# Patient Record
Sex: Female | Born: 1980 | Hispanic: Yes | Smoking: Never smoker
Health system: Southern US, Community
[De-identification: ages and names within clinical notes are randomized; demographics above are authoritative.]

## PROBLEM LIST (undated history)

## (undated) ENCOUNTER — Emergency Department (HOSPITAL_COMMUNITY): Admission: EM | Payer: No Typology Code available for payment source | Source: Home / Self Care

---
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
## 2010-11-04 ENCOUNTER — Emergency Department (HOSPITAL_COMMUNITY): Admission: EM | Admit: 2010-11-04 | Discharge: 2010-11-04 | Payer: Self-pay | Admitting: Emergency Medicine

## 2010-12-31 NOTE — L&D Delivery Note (Signed)
Delivery Note At  a viable female was delivered via  (Presentation: ; LOA ).  Cord was clamped and cut and infant was placed on mother's abdomen.  Cord blood was sampled. APGAR:8 9, ; weight 7lb13oz .   Placenta status: intact , .  Good uterine firming with fundal massage and pitocin.  Cord:  with the following complications: .    Anesthesia:  epidural Lacerations: none Est. Blood Loss (mL):  Mom to postpartum.  Baby to nursery-stable.  Lindaann Slough MD 08/17/2011, 2:46 AM

## 2011-01-22 ENCOUNTER — Emergency Department (HOSPITAL_COMMUNITY)
Admission: EM | Admit: 2011-01-22 | Discharge: 2011-01-23 | Payer: Self-pay | Source: Home / Self Care | Admitting: Emergency Medicine

## 2011-01-24 LAB — URINE MICROSCOPIC-ADD ON

## 2011-01-24 LAB — URINALYSIS, ROUTINE W REFLEX MICROSCOPIC
Urine Glucose, Fasting: NEGATIVE mg/dL
pH: 7.5 (ref 5.0–8.0)

## 2011-03-07 ENCOUNTER — Other Ambulatory Visit: Payer: Self-pay | Admitting: Family Medicine

## 2011-03-07 DIAGNOSIS — Z3689 Encounter for other specified antenatal screening: Secondary | ICD-10-CM

## 2011-03-07 LAB — RUBELLA ANTIBODY, IGM: Rubella: IMMUNE

## 2011-03-07 LAB — HIV ANTIBODY (ROUTINE TESTING W REFLEX): HIV: NONREACTIVE

## 2011-03-07 LAB — HEPATITIS B SURFACE ANTIGEN: Hepatitis B Surface Ag: NEGATIVE

## 2011-03-07 LAB — ANTIBODY SCREEN: Antibody Screen: NEGATIVE

## 2011-03-07 LAB — ABO/RH

## 2011-03-12 ENCOUNTER — Ambulatory Visit (HOSPITAL_COMMUNITY)
Admission: RE | Admit: 2011-03-12 | Discharge: 2011-03-12 | Disposition: A | Discharge status: Home / Self Care | Payer: Medicaid Other | Source: Ambulatory Visit | Attending: Family Medicine | Admitting: Family Medicine

## 2011-03-12 DIAGNOSIS — O358XX0 Maternal care for other (suspected) fetal abnormality and damage, not applicable or unspecified: Secondary | ICD-10-CM | POA: Insufficient documentation

## 2011-03-12 DIAGNOSIS — Z3689 Encounter for other specified antenatal screening: Secondary | ICD-10-CM

## 2011-03-12 DIAGNOSIS — Z8751 Personal history of pre-term labor: Secondary | ICD-10-CM | POA: Insufficient documentation

## 2011-03-12 DIAGNOSIS — Z1389 Encounter for screening for other disorder: Secondary | ICD-10-CM | POA: Insufficient documentation

## 2011-03-12 DIAGNOSIS — Z363 Encounter for antenatal screening for malformations: Secondary | ICD-10-CM | POA: Insufficient documentation

## 2011-03-12 IMAGING — US US OB DETAIL+14 WK
1 series · 12 of 28 positions shown · non-contrast
Comparison: none

[Series 1: us ob detail +14 wk · 12 of 88 slices shown]
[im 4/88]
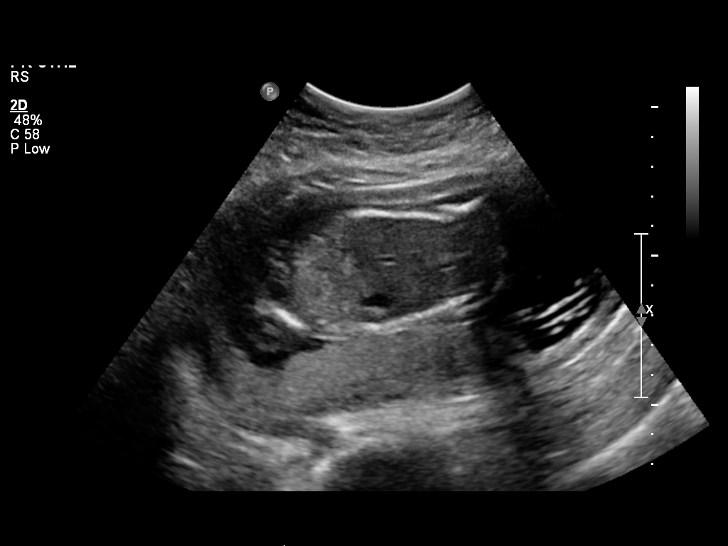
[im 10/88]
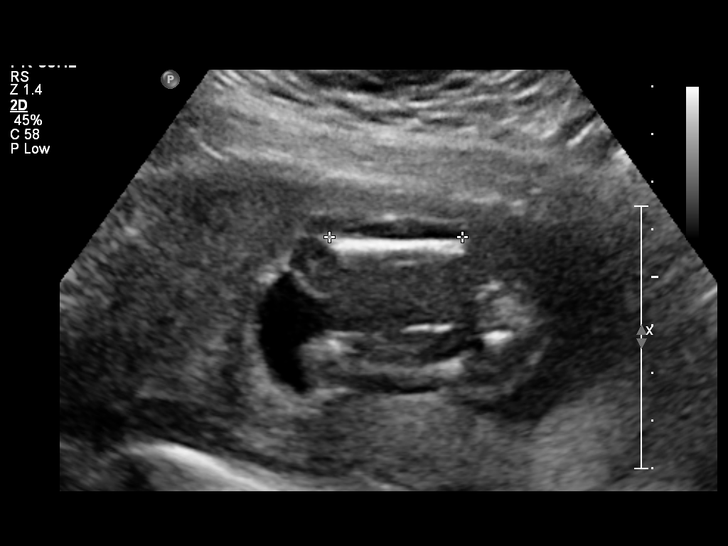
[im 17/88]
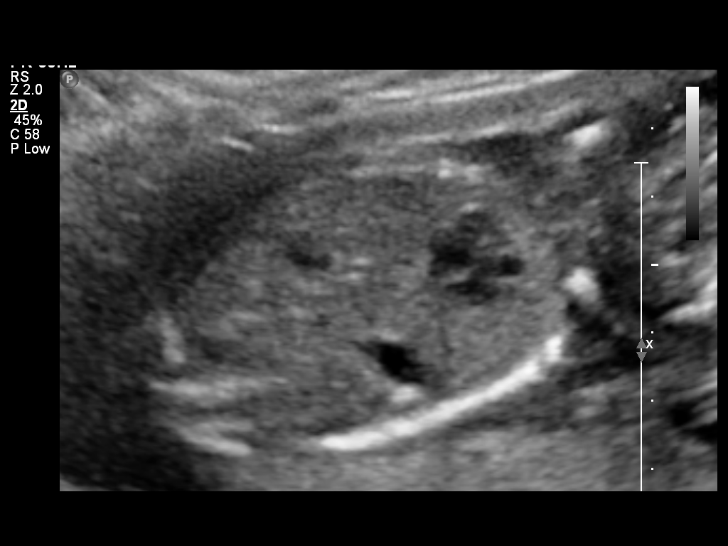
[im 26/88]
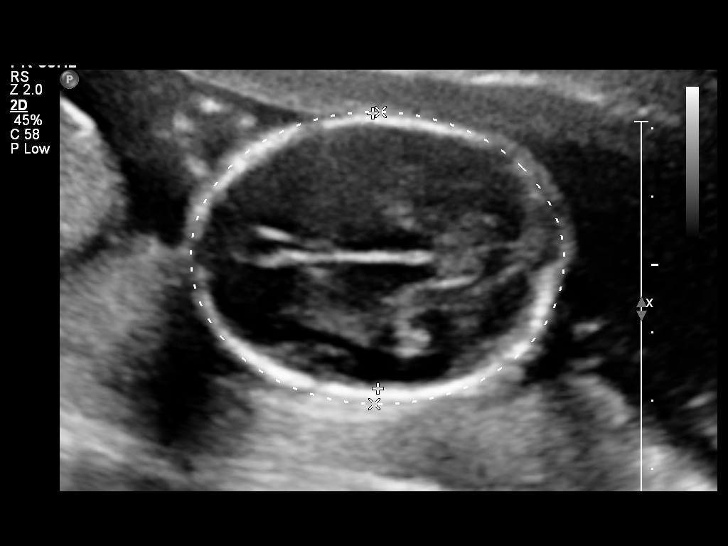
[im 33/88]
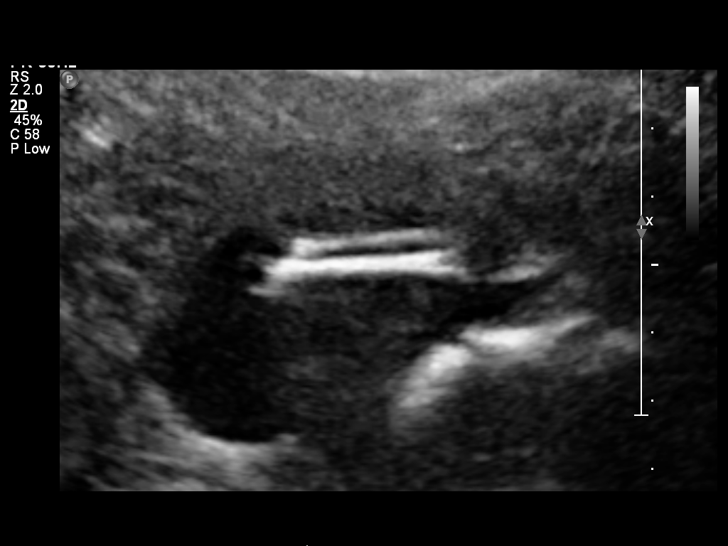
[im 39/88]
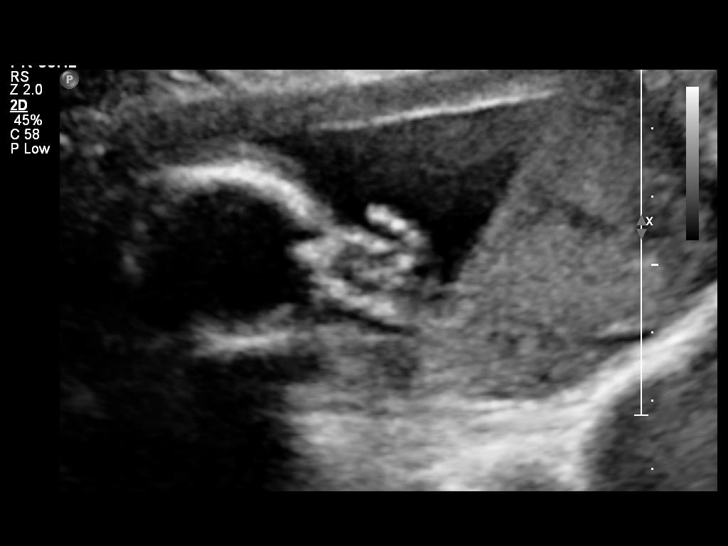
[im 49/88]
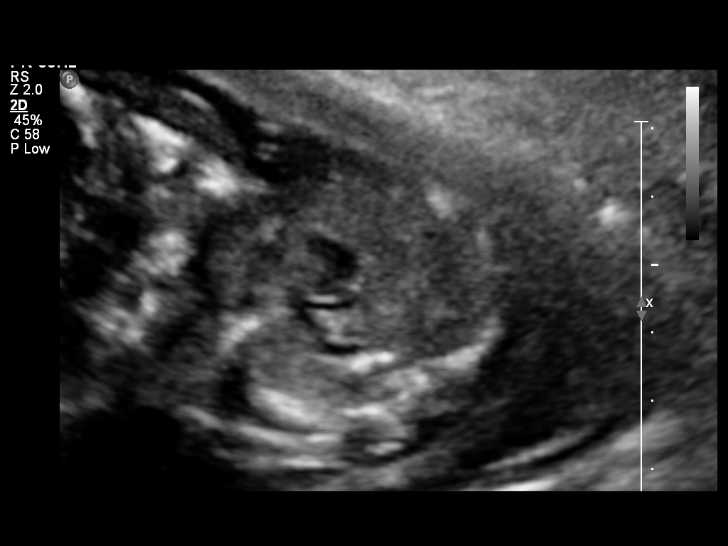
[im 55/88]
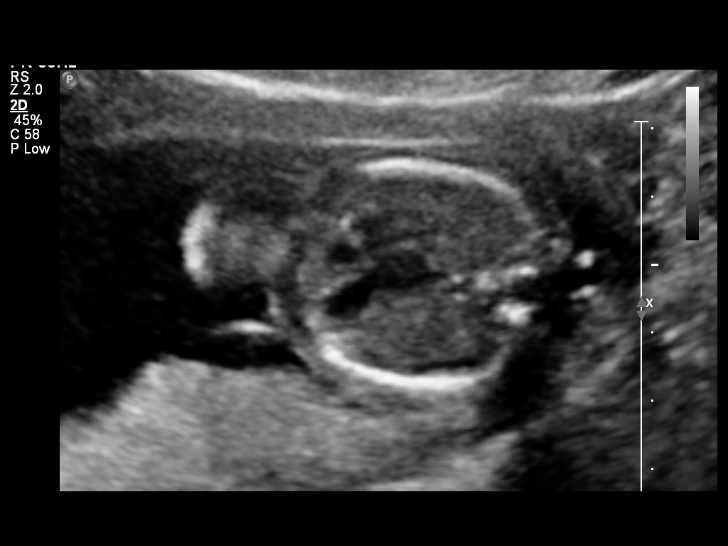
[im 62/88]
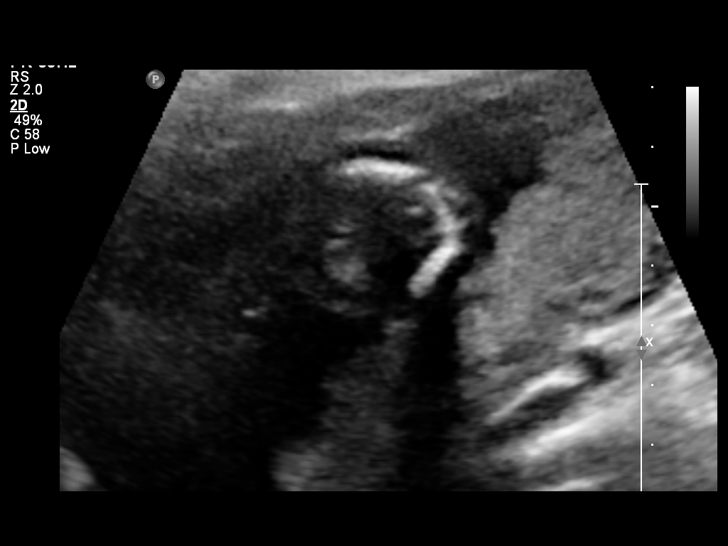
[im 71/88]
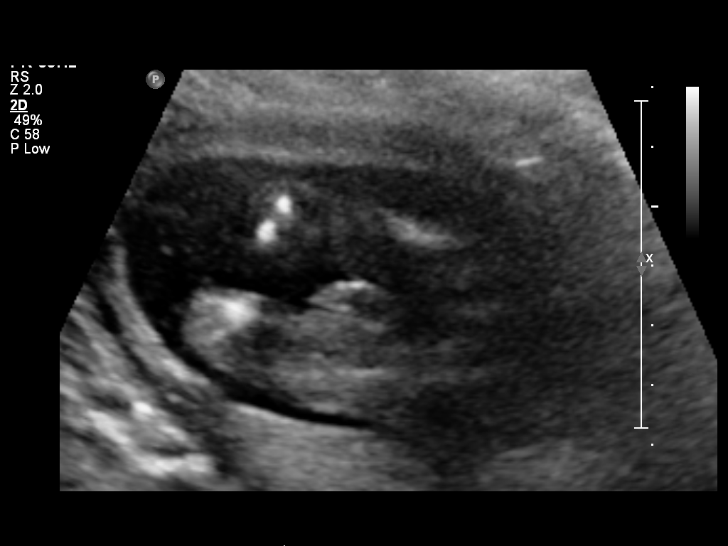
[im 78/88]
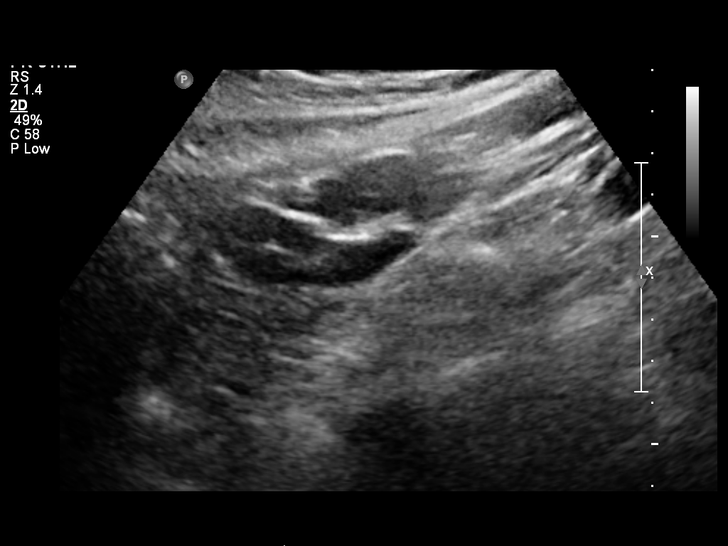
[im 84/88]
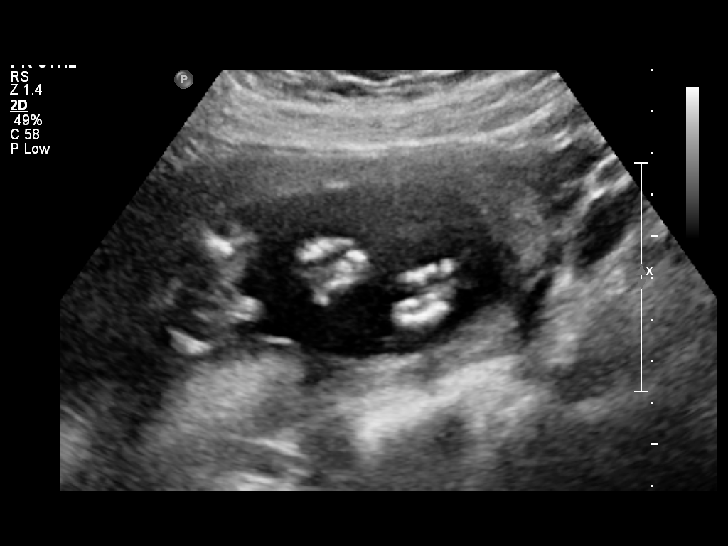

[12 of 28 positions shown; findings below may reference images not displayed]

OBSTETRICS REPORT
                      (Signed Final [DATE] [DATE])

 Order#:         [PHONE_NUMBER]_O
Procedures

 US OB DETAIL + 14 WK                                  76811.0
Indications

 Detailed fetal anatomic survey                        655.83 [4X]
 Poor obstetric history: Previous preterm delivery     [4X]
Fetal Evaluation

 Cardiac Activity:  Observed at normal rate
 Presentation:      Cephalic
 Placenta:          Posterior, above cervical
                    os
 P. Cord            Visualized
 Insertion:

 Amniotic Fluid
 AFI FV:      Subjectively within normal limits
                                             Larg Pckt:     7.1  cm
Biometry

 BPD:     40.2  mm     G. Age:  18w 1d                CI:        69.52   70 - 86
                                                      FL/HC:      18.0   15.8 -
                                                                         18
 HC:     153.9  mm     G. Age:  18w 3d       54  %    HC/AC:      1.12   1.07 -

 AC:     137.8  mm     G. Age:  19w 1d       80  %    FL/BPD:
 FL:      27.7  mm     G. Age:  18w 3d       57  %    FL/AC:      20.1   20 - 24
 HUM:     28.5  mm     G. Age:  19w 1d       85  %
 NFT:     4.17  mm

 Est. FW:     258  gm      0 lb 9 oz     59  %
Gestational Age

 LMP:           18w 1d        Date:  [DATE]                 EDD:   [DATE]
 U/S Today:     18w 4d                                        EDD:   [DATE]
 Best:          18w 1d     Det. By:  LMP  ([DATE])          EDD:   [DATE]
Anatomy
 Cranium:           Appears normal      Aortic Arch:       Appears normal
 Fetal Cavum:       Appears normal      Ductal Arch:       Appears normal
 Ventricles:        Appears normal      Diaphragm:         Appears normal
 Choroid Plexus:    Appears normal      Stomach:           Appears normal
 Cerebellum:        Appears normal      Abdomen:           Appears normal
 Posterior Fossa:   Appears normal      Abdominal Wall:    Appears nml
                                                           (cord insert,
                                                           abd wall)
 Nuchal Fold:       Appears normal      Cord Vessels:      Appears normal
                    (neck, nuchal                          (3 vessel cord)
                    fold)
 Face:              Appears normal      Kidneys:           Appear normal
                    (lips/profile/orbit
                    s)
 Heart:             Appears normal      Bladder:           Appears normal
                    (4 chamber &
                    axis)
 RVOT:              Appears normal      Spine:             Appears normal
 LVOT:              Appears normal      Limbs:             Appears normal
                                                           (hands, ankles,
                                                           feet)

 Other:     Male gender. Heels and 5th digit visualized. Nasal bone
            visualized.
Cervix Uterus Adnexa

 Cervical Length:    4.58     cm

 Cervix:       Normal appearance by transabdominal scan.

 Adnexa:     No abnormality visualized.
Impression

 Single living intrauterine pregnancy in cephalic presentation.
 The estimated gestational age is 18w 1d based on LMP
 ([DATE]).
 No fetal anomalies are identified.

## 2011-03-13 LAB — URINALYSIS, ROUTINE W REFLEX MICROSCOPIC
Bilirubin Urine: NEGATIVE
Glucose, UA: NEGATIVE mg/dL
Ketones, ur: NEGATIVE mg/dL
Leukocytes, UA: NEGATIVE
Nitrite: NEGATIVE
Protein, ur: NEGATIVE mg/dL
Specific Gravity, Urine: 1.029 (ref 1.005–1.030)
Urobilinogen, UA: 0.2 mg/dL (ref 0.0–1.0)
pH: 5.5 (ref 5.0–8.0)

## 2011-03-13 LAB — CBC
MCH: 31.4 pg (ref 26.0–34.0)
MCV: 92 fL (ref 78.0–100.0)
Platelets: 229 10*3/uL (ref 150–400)
RBC: 4.64 MIL/uL (ref 3.87–5.11)

## 2011-03-13 LAB — HCG, QUANTITATIVE, PREGNANCY: hCG, Beta Chain, Quant, S: <2 m[IU]/mL (ref <5)

## 2011-03-13 LAB — WET PREP, GENITAL

## 2011-03-13 LAB — BASIC METABOLIC PANEL
Calcium: 9.2 mg/dL (ref 8.4–10.5)
Chloride: 106 mEq/L (ref 96–112)
Creatinine, Ser: 0.61 mg/dL (ref 0.4–1.2)
GFR calc Af Amer: >60 mL/min (ref >60)
GFR calc non Af Amer: >60 mL/min (ref >60)

## 2011-03-13 LAB — URINE MICROSCOPIC-ADD ON

## 2011-03-13 LAB — GC/CHLAMYDIA PROBE AMP, GENITAL: Chlamydia, DNA Probe: POSITIVE — AB

## 2011-03-13 LAB — POCT PREGNANCY, URINE: Preg Test, Ur: NEGATIVE

## 2011-03-22 ENCOUNTER — Other Ambulatory Visit: Payer: Self-pay | Admitting: Obstetrics & Gynecology

## 2011-03-22 DIAGNOSIS — O09219 Supervision of pregnancy with history of pre-term labor, unspecified trimester: Secondary | ICD-10-CM

## 2011-03-22 LAB — POCT URINALYSIS DIP (DEVICE)
Bilirubin Urine: NEGATIVE
Glucose, UA: NEGATIVE mg/dL
Hgb urine dipstick: NEGATIVE
Specific Gravity, Urine: 1.025 (ref 1.005–1.030)
Urobilinogen, UA: 0.2 mg/dL (ref 0.0–1.0)
pH: 6 (ref 5.0–8.0)

## 2011-03-29 ENCOUNTER — Ambulatory Visit: Payer: Medicaid Other

## 2011-03-29 DIAGNOSIS — O09219 Supervision of pregnancy with history of pre-term labor, unspecified trimester: Secondary | ICD-10-CM

## 2011-04-05 ENCOUNTER — Ambulatory Visit: Payer: Medicaid Other

## 2011-04-05 DIAGNOSIS — O09219 Supervision of pregnancy with history of pre-term labor, unspecified trimester: Secondary | ICD-10-CM

## 2011-04-12 ENCOUNTER — Other Ambulatory Visit: Payer: Self-pay | Admitting: Obstetrics & Gynecology

## 2011-04-12 DIAGNOSIS — O09219 Supervision of pregnancy with history of pre-term labor, unspecified trimester: Secondary | ICD-10-CM

## 2011-04-12 LAB — POCT URINALYSIS DIP (DEVICE)
Bilirubin Urine: NEGATIVE
Hgb urine dipstick: NEGATIVE
Ketones, ur: NEGATIVE mg/dL
Protein, ur: NEGATIVE mg/dL
pH: 5.5 (ref 5.0–8.0)

## 2011-04-19 ENCOUNTER — Ambulatory Visit: Payer: Medicaid Other

## 2011-04-19 DIAGNOSIS — O09219 Supervision of pregnancy with history of pre-term labor, unspecified trimester: Secondary | ICD-10-CM

## 2011-04-19 DIAGNOSIS — Z331 Pregnant state, incidental: Secondary | ICD-10-CM

## 2011-04-26 DIAGNOSIS — Z331 Pregnant state, incidental: Secondary | ICD-10-CM

## 2011-04-26 DIAGNOSIS — O09219 Supervision of pregnancy with history of pre-term labor, unspecified trimester: Secondary | ICD-10-CM

## 2011-05-03 ENCOUNTER — Ambulatory Visit: Payer: Medicaid Other

## 2011-05-03 DIAGNOSIS — O09219 Supervision of pregnancy with history of pre-term labor, unspecified trimester: Secondary | ICD-10-CM

## 2011-05-10 ENCOUNTER — Other Ambulatory Visit: Payer: Self-pay | Admitting: Physician Assistant

## 2011-05-10 DIAGNOSIS — O09219 Supervision of pregnancy with history of pre-term labor, unspecified trimester: Secondary | ICD-10-CM

## 2011-05-10 DIAGNOSIS — Z331 Pregnant state, incidental: Secondary | ICD-10-CM

## 2011-05-10 LAB — POCT URINALYSIS DIP (DEVICE)
Ketones, ur: NEGATIVE mg/dL
Protein, ur: NEGATIVE mg/dL
Specific Gravity, Urine: 1.025 (ref 1.005–1.030)
pH: 5.5 (ref 5.0–8.0)

## 2011-05-17 ENCOUNTER — Ambulatory Visit: Payer: Medicaid Other

## 2011-05-17 DIAGNOSIS — O09219 Supervision of pregnancy with history of pre-term labor, unspecified trimester: Secondary | ICD-10-CM

## 2011-05-24 ENCOUNTER — Other Ambulatory Visit: Payer: Self-pay | Admitting: Obstetrics and Gynecology

## 2011-05-24 DIAGNOSIS — O09219 Supervision of pregnancy with history of pre-term labor, unspecified trimester: Secondary | ICD-10-CM

## 2011-05-24 LAB — POCT URINALYSIS DIP (DEVICE)
Hgb urine dipstick: NEGATIVE
Protein, ur: NEGATIVE mg/dL
Specific Gravity, Urine: 1.025 (ref 1.005–1.030)
Urobilinogen, UA: 0.2 mg/dL (ref 0.0–1.0)
pH: 6 (ref 5.0–8.0)

## 2011-05-31 ENCOUNTER — Ambulatory Visit: Payer: Medicaid Other

## 2011-05-31 DIAGNOSIS — O09219 Supervision of pregnancy with history of pre-term labor, unspecified trimester: Secondary | ICD-10-CM

## 2011-06-01 ENCOUNTER — Inpatient Hospital Stay (HOSPITAL_COMMUNITY)
Admission: AD | Admit: 2011-06-01 | Discharge: 2011-06-02 | Disposition: A | Discharge status: Home / Self Care | Payer: Medicaid Other | Source: Ambulatory Visit | Attending: Obstetrics & Gynecology | Admitting: Obstetrics & Gynecology

## 2011-06-01 DIAGNOSIS — O4700 False labor before 37 completed weeks of gestation, unspecified trimester: Secondary | ICD-10-CM | POA: Insufficient documentation

## 2011-06-01 LAB — FETAL FIBRONECTIN: Fetal Fibronectin: NEGATIVE

## 2011-06-01 LAB — AMNISURE RUPTURE OF MEMBRANE (ROM) NOT AT ARMC: Amnisure ROM: NEGATIVE

## 2011-06-01 LAB — WET PREP, GENITAL: Clue Cells Wet Prep HPF POC: NONE SEEN

## 2011-06-01 LAB — URINALYSIS, ROUTINE W REFLEX MICROSCOPIC
Bilirubin Urine: NEGATIVE
Glucose, UA: NEGATIVE mg/dL
Hgb urine dipstick: NEGATIVE
Ketones, ur: 15 mg/dL — AB
Protein, ur: NEGATIVE mg/dL

## 2011-06-07 ENCOUNTER — Other Ambulatory Visit: Payer: Self-pay | Admitting: Obstetrics & Gynecology

## 2011-06-07 DIAGNOSIS — O09219 Supervision of pregnancy with history of pre-term labor, unspecified trimester: Secondary | ICD-10-CM

## 2011-06-07 LAB — POCT URINALYSIS DIP (DEVICE)
Bilirubin Urine: NEGATIVE
Glucose, UA: NEGATIVE mg/dL
Nitrite: NEGATIVE
Urobilinogen, UA: 0.2 mg/dL (ref 0.0–1.0)

## 2011-06-14 ENCOUNTER — Ambulatory Visit: Payer: Medicaid Other

## 2011-06-14 DIAGNOSIS — O09219 Supervision of pregnancy with history of pre-term labor, unspecified trimester: Secondary | ICD-10-CM

## 2011-06-21 ENCOUNTER — Other Ambulatory Visit: Payer: Self-pay | Admitting: Family Medicine

## 2011-06-21 DIAGNOSIS — O09219 Supervision of pregnancy with history of pre-term labor, unspecified trimester: Secondary | ICD-10-CM

## 2011-06-21 LAB — POCT URINALYSIS DIP (DEVICE)
Bilirubin Urine: NEGATIVE
Glucose, UA: NEGATIVE mg/dL
Ketones, ur: NEGATIVE mg/dL
Leukocytes, UA: NEGATIVE
Nitrite: NEGATIVE
pH: 5.5 (ref 5.0–8.0)

## 2011-06-28 ENCOUNTER — Ambulatory Visit: Payer: Medicaid Other

## 2011-06-28 DIAGNOSIS — O09219 Supervision of pregnancy with history of pre-term labor, unspecified trimester: Secondary | ICD-10-CM

## 2011-07-05 ENCOUNTER — Other Ambulatory Visit: Payer: Self-pay | Admitting: Obstetrics and Gynecology

## 2011-07-05 DIAGNOSIS — O09219 Supervision of pregnancy with history of pre-term labor, unspecified trimester: Secondary | ICD-10-CM

## 2011-07-12 ENCOUNTER — Ambulatory Visit: Payer: Medicaid Other

## 2011-07-12 DIAGNOSIS — O09219 Supervision of pregnancy with history of pre-term labor, unspecified trimester: Secondary | ICD-10-CM

## 2011-07-16 ENCOUNTER — Inpatient Hospital Stay (HOSPITAL_COMMUNITY)
Admission: AD | Admit: 2011-07-16 | Discharge: 2011-07-16 | Disposition: A | Discharge status: Home / Self Care | Payer: Medicaid Other | Source: Ambulatory Visit | Attending: Obstetrics & Gynecology | Admitting: Obstetrics & Gynecology

## 2011-07-16 ENCOUNTER — Encounter (HOSPITAL_COMMUNITY): Payer: Self-pay

## 2011-07-16 DIAGNOSIS — O479 False labor, unspecified: Secondary | ICD-10-CM

## 2011-07-16 DIAGNOSIS — O4700 False labor before 37 completed weeks of gestation, unspecified trimester: Secondary | ICD-10-CM

## 2011-07-16 LAB — URINALYSIS, ROUTINE W REFLEX MICROSCOPIC
Ketones, ur: NEGATIVE mg/dL
Leukocytes, UA: NEGATIVE
Nitrite: NEGATIVE
Protein, ur: NEGATIVE mg/dL
Urobilinogen, UA: 1 mg/dL (ref 0.0–1.0)

## 2011-07-16 NOTE — Progress Notes (Signed)
36.1 WKS G6P3 HIGH RISK B/C PTL. SVD LOOSE 1/THICK. C/O PELVIC PRESSURE AND CONTRACTIONS.

## 2011-07-16 NOTE — ED Provider Notes (Signed)
Chief Complaint  Patient presents with  . Abdominal Pain  [id number] [redacted]w[redacted]d Presents with 2 d hx mild contractions and today more pelvic pressure. No LOF, VB or decreased FM. No urinary or vaginitis sx.   Filed Vitals:   07/16/11 1514  BP: 100/62  Pulse: 94  Temp: 98.9 F (37.2 C)  Resp: 16   No results found for this or any previous visit (from the past 24 hour(s)). UA pending  Abd soft, NT, c/w dates Cx 1/ thick/-3 per Victorino Dike RN  FHR 130, mod variability, reactive UCs occ mild  Imp/Plan: Prodromal contractions - reassured. S/sx labor reviewed. May call [place] to reschedule tomorrow's appt since seen today. Brielynn Sekula 4:45 PM

## 2011-07-16 NOTE — Progress Notes (Signed)
Pt states she has been having some contractions off and on, now is feeling more abdominal pressure. Reports good fetal movement, no leaking or bleeding. Pt has been seen in the [facility] clinic and getting 17 P injections.

## 2011-07-19 ENCOUNTER — Other Ambulatory Visit: Payer: Self-pay | Admitting: Family Medicine

## 2011-07-19 ENCOUNTER — Other Ambulatory Visit: Payer: Self-pay | Admitting: Obstetrics & Gynecology

## 2011-07-19 DIAGNOSIS — O09219 Supervision of pregnancy with history of pre-term labor, unspecified trimester: Secondary | ICD-10-CM

## 2011-07-20 LAB — GC/CHLAMYDIA PROBE AMP, GENITAL
Chlamydia, DNA Probe: NEGATIVE
GC Probe Amp, Genital: NEGATIVE

## 2011-07-21 LAB — STREP B DNA PROBE: GBSP: NEGATIVE

## 2011-07-26 ENCOUNTER — Other Ambulatory Visit: Payer: Self-pay | Admitting: Obstetrics & Gynecology

## 2011-07-26 DIAGNOSIS — O09219 Supervision of pregnancy with history of pre-term labor, unspecified trimester: Secondary | ICD-10-CM

## 2011-08-02 ENCOUNTER — Other Ambulatory Visit: Payer: Self-pay | Admitting: Obstetrics & Gynecology

## 2011-08-02 DIAGNOSIS — O09219 Supervision of pregnancy with history of pre-term labor, unspecified trimester: Secondary | ICD-10-CM

## 2011-08-08 ENCOUNTER — Inpatient Hospital Stay (HOSPITAL_COMMUNITY)
Admission: AD | Admit: 2011-08-08 | Discharge: 2011-08-08 | Disposition: A | Discharge status: Home / Self Care | Payer: Medicaid Other | Source: Ambulatory Visit | Attending: Obstetrics & Gynecology | Admitting: Obstetrics & Gynecology

## 2011-08-08 ENCOUNTER — Encounter (HOSPITAL_COMMUNITY): Payer: Self-pay

## 2011-08-08 DIAGNOSIS — Z331 Pregnant state, incidental: Secondary | ICD-10-CM

## 2011-08-08 DIAGNOSIS — O99891 Other specified diseases and conditions complicating pregnancy: Secondary | ICD-10-CM | POA: Insufficient documentation

## 2011-08-08 LAB — WET PREP, GENITAL: Yeast Wet Prep HPF POC: NONE SEEN

## 2011-08-08 NOTE — Progress Notes (Signed)
Patient was laying down and felt something warm unsure if water broken has not leaked since then, no contractions term G4P3

## 2011-08-08 NOTE — Progress Notes (Signed)
PT SAYS SHE WAS AT HOME IN  BED AT 0600- SHE FELT  WARM WATER COME OUT- THEN  WENT TO B-ROOM-  BED WAS WET.   NO MORE SINCE THEN. SAYS PERINEUM IS DRY NOW

## 2011-08-08 NOTE — Progress Notes (Signed)
UP TO B-ROOM 

## 2011-08-08 NOTE — ED Provider Notes (Signed)
Ashley Mcdaniel is a 30 y.o. female [phone] with IUP at [redacted]w[redacted]d presenting for possible ROM. Pt states she has been having none contractions, associated with none vaginal bleeding.  Membranes are possibly ruptures, with active fetal movement.  She reports that she awoke this AM as 6 with a gush of fluid that wet her bed.  She got up, used the restroom and then decided to come to the MAU for evaluation.  No blood in fluid.  Has not been leaking fluid since then.  She reports that her first pregnancy was preterm, her second has SROM, and third required induction.   [facility]are at [facility], then transitioned to [facility] since 17.3 wks  Prenatal History/Complications: On 17-P due to previous preterm delivery  Past Medical History: Past Medical History  Diagnosis Date  . Preterm delivery     at 34 weeks    Past Surgical History: Past Surgical History  Procedure Date  . Cholecystectomy 2001    Obstetrical History: OB History    Grav Para Term Preterm Abortions TAB SAB Ect Mult Living   6 3 2 1 2  2   3      Social History: History   Social History  . Marital Status: Legally Separated    Spouse Name: N/A    Number of Children: N/A  . Years of Education: N/A   Social History Main Topics  . Smoking status: Never Smoker   . Smokeless tobacco: Never Used  . Alcohol Use: No  . Drug Use: No  . Sexually Active: Yes -- Female partner(s)   Other Topics Concern  . Not on file   Social History Narrative  . No narrative on file    Family History: No family history on file.  Allergies: Allergies  Allergen Reactions  . Penicillins Cross Reactors Shortness Of Breath    Prescriptions prior to admission  Medication Sig Dispense Refill  . prenatal vitamin w/FE, FA (PRENATAL 1 + 1) 27-1 MG TABS Take 1 tablet by mouth daily.          Review of Systems - Negative except as described in HPI   Blood pressure 117/57, pulse 56, temperature 98.1 F (36.7 C), temperature source Oral, resp. rate 16,  height 5\' 3"  (1.6 m), weight 172 lb 6.4 oz (78.2 kg). BP 117/57  Pulse 56  Temp(Src) 98.1 F (36.7 C) (Oral)  Resp 16  Ht 5\' 3"  (1.6 m)  Wt 172 lb 6.4 oz (78.2 kg)  BMI 30.54 kg/m2 General appearance: alert, cooperative and no distress Lungs: clear to auscultation bilaterally Heart: regular rate and rhythm, S1, S2 normal, no murmur, click, rub or gallop cephalic Baseline: 145 bpm, Variability: Good {> 6 bpm), Accelerations: Reactive and Decelerations: Absent None Dilation: 2 Effacement (%): 30 Station: -2 Exam by:: DR Sohail Capraro-  COLLECTED WET PREP AND AMNISURE Possible pooling in posterior fornix, negative fern.   Prenatal labs: ABO, Rh: A Pos   Antibody: Neg   Rubella:Imm   RPR:Neg    HBsAg:NR    HIV:Neg    GBS: NEGATIVE (07/19 1316)  1 hr Glucola: 106 Genetic screening: Normal Anatomy US: Normal   Assessment: Ashley Mcdaniel is a 29 y.o. [id number] with an IUP at [redacted]w[redacted]d presenting for r/o rupture  Plan: 1. Will collect amniosure and wet prep.  Monitor while awaiting results. 2. Will admit if ruptured, d/c if not. 3. Plan discussed with Dr. Asencion Gowda 08/08/2011, 10:32 AM

## 2011-08-08 NOTE — Progress Notes (Signed)
DENIES MRSA AND HSV

## 2011-08-08 NOTE — Progress Notes (Signed)
[facility] AT [id number].

## 2011-08-08 NOTE — ED Provider Notes (Signed)
Case discussed with Dr. Louanne Belton.  Fern slide reviewed and is negative.  amnisure negative.  D/c home with labor warnings.

## 2011-08-09 ENCOUNTER — Ambulatory Visit: Payer: Medicaid Other | Admitting: Family Medicine

## 2011-08-09 DIAGNOSIS — O0990 Supervision of high risk pregnancy, unspecified, unspecified trimester: Secondary | ICD-10-CM

## 2011-08-09 DIAGNOSIS — O09219 Supervision of pregnancy with history of pre-term labor, unspecified trimester: Secondary | ICD-10-CM

## 2011-08-09 LAB — POCT URINALYSIS DIP (DEVICE)
Bilirubin Urine: NEGATIVE
Ketones, ur: NEGATIVE mg/dL
Specific Gravity, Urine: 1.02 (ref 1.005–1.030)
pH: 6 (ref 5.0–8.0)

## 2011-08-13 ENCOUNTER — Telehealth: Payer: Self-pay | Admitting: *Deleted

## 2011-08-13 NOTE — Telephone Encounter (Signed)
Pt left message that she was having some cramping and spotting- wants to know when to come to hospital.  I called pt and told her to come to the hospital when her UC's have been q7-8' x1hr or more as this is her 4th baby. Also, she should come in if she feels fluid leaking, has heavy bleeding or decr. FM.  Pt confirms the baby is moving well daily. She states her next appt for follow up is 08/16/11. Pt voiced understanding.

## 2011-08-15 ENCOUNTER — Inpatient Hospital Stay (HOSPITAL_COMMUNITY)
Admission: AD | Admit: 2011-08-15 | Discharge: 2011-08-15 | Disposition: A | Discharge status: Home Health | Payer: Medicaid Other | Source: Ambulatory Visit | Attending: Obstetrics & Gynecology | Admitting: Obstetrics & Gynecology

## 2011-08-15 ENCOUNTER — Encounter (HOSPITAL_COMMUNITY): Payer: Self-pay | Admitting: *Deleted

## 2011-08-15 DIAGNOSIS — O479 False labor, unspecified: Secondary | ICD-10-CM | POA: Insufficient documentation

## 2011-08-15 NOTE — Progress Notes (Signed)
Not in the lobby when called to triage.  

## 2011-08-15 NOTE — ED Provider Notes (Addendum)
[name] is a 30 y.o. female [phone] with IUP at 103w3d with a cc of contractions. Pt states the contractions started at 330 am this morning and were regular at about 7-8 minute intervals.  Pt states there is pain with the contractions in the lower abdomen and rates it as 5/10 pain.  Pt states there was vaginal spotting on 08/13/11 throughout the day, fetal movement has been normal.  Denies LOF, vaginal discharge. Denies HA, dizziness, light headedness, RUQ pain.       Prenatal History/Complications:  On 17-P due to previous preterm delivery at 28 weeks   Past Medical History:  Past Medical History   Diagnosis  Date   .  Preterm delivery      at 28 weeks    Past Surgical History:  Past Surgical History   Procedure  Date   .  Cholecystectomy  2001    Obstetrical History:  OB History    Grav  Para  Term  Preterm  Abortions  TAB  SAB  Ect  Mult  Living    6  3  2  1  2   2    3      She reports that her first pregnancy was preterm, her second has SROM, and third required induction.    Social History:  History    Social History   .  Marital Status:  Legally Separated     Spouse Name:  N/A     Number of Children:  N/A   .  Years of Education:  N/A    Social History Main Topics   .  Smoking status:  Never Smoker   .  Smokeless tobacco:  Never Used   .  Alcohol Use:  No   .  Drug Use:  No   .  Sexually Active:  Yes -- Female partner(s)    Other Topics  Concern   .  Not on file    Social History Narrative   .  No narrative on file    Family History:  No family history on file.  Allergies:  Allergies   Allergen  Reactions   .  Penicillins Cross Reactors  Shortness Of Breath    Prescriptions prior to admission   Medication  Sig  Dispense  Refill   .  prenatal vitamin w/FE, FA (PRENATAL 1 + 1) 27-1 MG TABS  Take 1 tablet by mouth daily.      Review of Systems - Negative except as described in HPI   Filed Vitals:   08/15/11 0856 08/15/11 0913 08/15/11 0948  BP:  107/68 108/69 100/60  Pulse: 60 64 60  Temp: 98.7 F (37.1 C)    TempSrc: Oral    Resp: 16    Height: 5\' 1"  (1.549 m)    Weight: 77.384 kg (170 lb 9.6 oz)    SpO2: 99%     Review of Systems  All other systems reviewed and are negative.   Physical Exam  Constitutional: She is oriented to person, place, and time. She appears well-developed and well-nourished.  HENT:  Head: Normocephalic.  Cardiovascular: Normal rate, regular rhythm and normal heart sounds.   Pulmonary/Chest: Effort normal and breath sounds normal.  Neurological: She is alert and oriented to person, place, and time.  Skin: Skin is warm and dry.     Baseline: 130s bpm, Variability: Good , Accelerations: Reactive and Decelerations: Absent   Dilation: 2  Effacement (%): 30  Station:  -3 Exam by:: Dierdre Christy Gentles  CNM    Prenatal labs:  ABO, Rh: A Pos  Antibody: Neg  Rubella:Imm  RPR:Neg  HBsAg:NR  HIV:Neg  GBS: NEGATIVE (07/19 1316)  1 hr Glucola: 106  Genetic screening: Normal  Anatomy US: Normal  Assessment:  Ashley Mcdaniel is a 30 y.o. [id number] with an IUP at [redacted]w[redacted]d presenting for contractions Plan:  1. Monitor patient to see if any changes to cervix and contractions   Payton Doughty PA-s 08/15/11 09:55  Pt rechecked after walking: SVE: 3/50/-2, contractions every5-32min Discharged home with labor precautions. Case discussed with Deidre Rhys Martini PGY1

## 2011-08-15 NOTE — ED Notes (Signed)
Bernita Buffy, CNM discussed current status and D/C instrs., D/C'd EFM.  Encouragement to pt. Questions answered.

## 2011-08-15 NOTE — Progress Notes (Signed)
Pt states she is having contractions every 7 minutes, no bleeding, leaking and good fetal movement.

## 2011-08-16 ENCOUNTER — Encounter (HOSPITAL_COMMUNITY): Payer: Self-pay | Admitting: *Deleted

## 2011-08-16 ENCOUNTER — Inpatient Hospital Stay (HOSPITAL_COMMUNITY): Payer: Medicaid Other | Admitting: Anesthesiology

## 2011-08-16 ENCOUNTER — Encounter (HOSPITAL_COMMUNITY): Payer: Self-pay | Admitting: Anesthesiology

## 2011-08-16 ENCOUNTER — Inpatient Hospital Stay (HOSPITAL_COMMUNITY)
Admission: AD | Admit: 2011-08-16 | Discharge: 2011-08-18 | DRG: 775 | Disposition: A | Discharge status: Home / Self Care | Payer: Medicaid Other | Source: Ambulatory Visit | Attending: Obstetrics & Gynecology | Admitting: Obstetrics & Gynecology

## 2011-08-16 DIAGNOSIS — O09299 Supervision of pregnancy with other poor reproductive or obstetric history, unspecified trimester: Secondary | ICD-10-CM

## 2011-08-16 DIAGNOSIS — Z3480 Encounter for supervision of other normal pregnancy, unspecified trimester: Secondary | ICD-10-CM

## 2011-08-16 DIAGNOSIS — O09219 Supervision of pregnancy with history of pre-term labor, unspecified trimester: Secondary | ICD-10-CM

## 2011-08-16 LAB — CBC
MCH: 30.3 pg (ref 26.0–34.0)
Platelets: 152 10*3/uL (ref 150–400)
RBC: 3.79 MIL/uL — ABNORMAL LOW (ref 3.87–5.11)
WBC: 8.7 10*3/uL (ref 4.0–10.5)

## 2011-08-16 LAB — POCT URINALYSIS DIP (DEVICE)
Ketones, ur: NEGATIVE mg/dL
Protein, ur: NEGATIVE mg/dL
Specific Gravity, Urine: 1.02 (ref 1.005–1.030)

## 2011-08-16 MED ORDER — LIDOCAINE HCL 1.5 % IJ SOLN
INTRAMUSCULAR | Status: DC | PRN
  Administered 2011-08-16 (×2): 4 mL via EPIDURAL

## 2011-08-16 MED ORDER — IBUPROFEN 600 MG PO TABS
600.0000 mg | ORAL_TABLET | Freq: Four times a day (QID) | ORAL | Status: DC | PRN
  Administered 2011-08-17: 600 mg via ORAL
  Filled 2011-08-16: qty 1

## 2011-08-16 MED ORDER — LACTATED RINGERS IV SOLN
INTRAVENOUS | Status: DC
  Administered 2011-08-16: 21:00:00 via INTRAVENOUS

## 2011-08-16 MED ORDER — CITRIC ACID-SODIUM CITRATE 334-500 MG/5ML PO SOLN: 30.0000 mL | ORAL | Status: DC | PRN

## 2011-08-16 MED ORDER — LACTATED RINGERS IV SOLN: 500.0000 mL | Freq: Once | INTRAVENOUS | Status: DC

## 2011-08-16 MED ORDER — OXYCODONE-ACETAMINOPHEN 5-325 MG PO TABS: 2.0000 | ORAL_TABLET | ORAL | Status: DC | PRN

## 2011-08-16 MED ORDER — EPHEDRINE 5 MG/ML INJ
10.0000 mg | INTRAVENOUS | Status: DC | PRN
  Filled 2011-08-16 (×2): qty 4

## 2011-08-16 MED ORDER — OXYTOCIN BOLUS FROM INFUSION
500.0000 mL | Freq: Once | INTRAVENOUS | Status: DC
  Filled 2011-08-16: qty 500
  Filled 2011-08-16: qty 1000

## 2011-08-16 MED ORDER — LIDOCAINE HCL (PF) 1 % IJ SOLN
30.0000 mL | INTRAMUSCULAR | Status: DC | PRN
  Filled 2011-08-16 (×2): qty 30

## 2011-08-16 MED ORDER — FLEET ENEMA 7-19 GM/118ML RE ENEM: 1.0000 | ENEMA | RECTAL | Status: DC | PRN

## 2011-08-16 MED ORDER — PHENYLEPHRINE 40 MCG/ML (10ML) SYRINGE FOR IV PUSH (FOR BLOOD PRESSURE SUPPORT)
80.0000 ug | PREFILLED_SYRINGE | INTRAVENOUS | Status: DC | PRN
  Filled 2011-08-16: qty 5

## 2011-08-16 MED ORDER — FENTANYL 2.5 MCG/ML BUPIVACAINE 1/10 % EPIDURAL INFUSION (WH - ANES)
14.0000 mL/h | INTRAMUSCULAR | Status: DC
  Administered 2011-08-16 – 2011-08-17 (×2): 14 mL/h via EPIDURAL
  Filled 2011-08-16 (×2): qty 60

## 2011-08-16 MED ORDER — EPHEDRINE 5 MG/ML INJ
10.0000 mg | INTRAVENOUS | Status: DC | PRN
  Filled 2011-08-16: qty 4

## 2011-08-16 MED ORDER — DIPHENHYDRAMINE HCL 50 MG/ML IJ SOLN: 12.5000 mg | INTRAMUSCULAR | Status: DC | PRN

## 2011-08-16 MED ORDER — ONDANSETRON HCL 4 MG/2ML IJ SOLN: 4.0000 mg | Freq: Four times a day (QID) | INTRAMUSCULAR | Status: DC | PRN

## 2011-08-16 MED ORDER — PHENYLEPHRINE 40 MCG/ML (10ML) SYRINGE FOR IV PUSH (FOR BLOOD PRESSURE SUPPORT)
80.0000 ug | PREFILLED_SYRINGE | INTRAVENOUS | Status: DC | PRN
  Filled 2011-08-16 (×2): qty 5

## 2011-08-16 MED ORDER — LACTATED RINGERS IV SOLN: 500.0000 mL | INTRAVENOUS | Status: DC | PRN

## 2011-08-16 MED ORDER — ACETAMINOPHEN 325 MG PO TABS: 650.0000 mg | ORAL_TABLET | ORAL | Status: DC | PRN

## 2011-08-16 MED ORDER — OXYTOCIN 20 UNITS IN LACTATED RINGERS INFUSION - SIMPLE
125.0000 mL/h | INTRAVENOUS | Status: DC
  Administered 2011-08-17: 125 mL/h via INTRAVENOUS

## 2011-08-16 MED ORDER — NALBUPHINE SYRINGE 5 MG/0.5 ML
5.0000 mg | INJECTION | INTRAMUSCULAR | Status: DC | PRN
  Filled 2011-08-16: qty 0.5

## 2011-08-16 NOTE — Plan of Care (Signed)
Problem: Consults Goal: Birthing Suites Patient Information Press F2 to bring up selections list   Pt > [redacted] weeks EGA     

## 2011-08-16 NOTE — ED Provider Notes (Signed)
Agree with above note.  Quinterious Walraven H. 08/16/2011 11:39 AM

## 2011-08-16 NOTE — Progress Notes (Signed)
Ashley Mcdaniel is a 30 y.o. [id number] at [redacted]w[redacted]d admitted for active labor  Subjective:   Objective: BP 108/73  Pulse 63  Temp(Src) 97.7 F (36.5 C) (Oral)  Resp 18  Ht 5\' 3"  (1.6 m)  Wt 171 lb 6.4 oz (77.747 kg)  BMI 30.36 kg/m2  SpO2 100%      FHT:  FHR: 125 bpm, variability: moderate,  accelerations:  Present,  decelerations:  Absent UC:   regular, every 4-6 minutes SVE:   7/80/-1 Labs: Lab Results  Component Value Date   WBC 8.7 08/16/2011   HGB 11.5* 08/16/2011   HCT 34.1* 08/16/2011   MCV 90.0 08/16/2011   PLT 152 08/16/2011    Assessment / Plan: Spontaneous labor, progressing normally  Labor: Progressing normally Fetal Wellbeing:  Category I Pain Control:  Epidural I/D:  n/a Anticipated MOD:  NSVD  Lindaann Slough MD 08/16/2011, 10:42 PM

## 2011-08-16 NOTE — Anesthesia Procedure Notes (Signed)
Epidural Patient location during procedure: OB Start time: 08/16/2011 10:26 PM  Staffing Anesthesiologist: Davari Lopes A. Performed by: anesthesiologist   Preanesthetic Checklist Completed: patient identified, site marked, surgical consent, pre-op evaluation, timeout performed, IV checked, risks and benefits discussed and monitors and equipment checked  Epidural Patient position: sitting Prep: site prepped and draped and DuraPrep Patient monitoring: continuous pulse ox and blood pressure Approach: midline Injection technique: LOR air  Needle:  Needle type: Tuohy  Needle gauge: 17 G Needle length: 9 cm Needle insertion depth: 5 cm cm Catheter type: closed end flexible Catheter size: 19 Gauge Catheter at skin depth: 10 cm Test dose: negative and 1.5% lidocaine  Assessment Events: blood not aspirated, injection not painful, no injection resistance, negative IV test and no paresthesia  Additional Notes Patient is more comfortable after epidural dosed. Please see RN's note for documentation of vital signs and FHR which are stable.

## 2011-08-16 NOTE — Anesthesia Preprocedure Evaluation (Signed)
Anesthesia Evaluation  Name, MR# and DOB Patient awake  General Assessment Comment  Reviewed: Allergy & Precautions, H&P , Patient's Chart, lab work & pertinent test results  Airway Mallampati: III TM Distance: >3 FB Neck ROM: Full    Dental No notable dental hx. (+) Teeth Intact   Pulmonary  clear to auscultation  pulmonary exam normalPulmonary Exam Normal breath sounds clear to auscultation none    Cardiovascular Regular Normal    Neuro/Psych Negative Neurological ROS  Negative Psych ROS  GI/Hepatic/Renal negative GI ROS, negative Liver ROS, and negative Renal ROS (+)       Endo/Other  Negative Endocrine ROS (+)      Abdominal   Musculoskeletal negative musculoskeletal ROS (+)   Hematology negative hematology ROS (+)   Peds  Reproductive/Obstetrics negative OB ROS (+) Pregnancy    Anesthesia Other Findings             Anesthesia Physical Anesthesia Plan  ASA: II  Anesthesia Plan: Epidural   Post-op Pain Management:    Induction:   Airway Management Planned:   Additional Equipment:   Intra-op Plan:   Post-operative Plan:   Informed Consent: I have reviewed the patients History and Physical, chart, labs and discussed the procedure including the risks, benefits and alternatives for the proposed anesthesia with the patient or authorized representative who has indicated his/her understanding and acceptance.     Plan Discussed with: Anesthesiologist  Anesthesia Plan Comments:         Anesthesia Quick Evaluation

## 2011-08-16 NOTE — Progress Notes (Signed)
Pt states she was 4 cm in the office this am. Was seen in MAU 8-15 and was 3 cm. States contractions every 5-7 minutes with bloody show.

## 2011-08-16 NOTE — Progress Notes (Signed)
Dr Orvan Falconer notified of VE.  Notified of ctx pattern of q 3-5 min.  Will come recheck pt after one hour.

## 2011-08-16 NOTE — Progress Notes (Signed)
Called birthing suites for room. Will call back for report.

## 2011-08-16 NOTE — H&P (Signed)
Ashley Mcdaniel is a 30 y.o. female [phone] with IUP at [redacted]w[redacted]d presenting for active labor. Pt states she has been having regular, every 5-7 minutes since 1200 pm, associated with spotting vaginal bleeding, intact, with active.  She desires to IUD. She desires to breastfeed.   PNCare at [facility] since 17.3 wks  Prenatal History/Complications: H/o PTL/PTD currently on 17-P Chlamydia treated during this pregnancy-TOC neg  Past Medical History: Past Medical History  Diagnosis Date  . Preterm delivery     at 34 weeks    Past Surgical History: Past Surgical History  Procedure Date  . Cholecystectomy 2001    Obstetrical History: OB History    Grav Para Term Preterm Abortions TAB SAB Ect Mult Living   6 3 2 1 2  2   3       Gynecological History: Chlamydia treated during this pregnancy-TOC neg.  Social History: History   Social History  . Marital Status: Legally Separated    Spouse Name: N/A    Number of Children: N/A  . Years of Education: N/A   Social History Main Topics  . Smoking status: Never Smoker   . Smokeless tobacco: Never Used  . Alcohol Use: No  . Drug Use: No  . Sexually Active: Yes -- Female partner(s)   Other Topics Concern  . None   Social History Narrative  . None    Family History: Family History  Problem Relation Age of Onset  . Cancer Mother     Allergies: Allergies  Allergen Reactions  . Penicillins Cross Reactors Shortness Of Breath    Prescriptions prior to admission  Medication Sig Dispense Refill  . prenatal vitamin w/FE, FA (PRENATAL 1 + 1) 27-1 MG TABS Take 1 tablet by mouth daily.          Review of Systems - Negative except per HPI   Blood pressure 113/73, pulse 65, temperature 98.3 F (36.8 C), temperature source Oral, resp. rate 20, height 5\' 3"  (1.6 m), weight 77.747 kg (171 lb 6.4 oz), SpO2 98.00%. General appearance: alert and cooperative Lungs: clear to auscultation bilaterally Heart: regular rate and rhythm, S1, S2  normal, no murmur, click, rub or gallop Abdomen: soft, non-tender; bowel sounds normal; no masses,  no organomegaly and gravid, size cwd, EFW 7 lbs by leopolds, vertex by leopolds.  Extremities: extremities normal, atraumatic, no cyanosis or edema cephalic Baseline: 125-130 bpm, Variability: Good {> 6 bpm), Accelerations: Reactive and Decelerations: Absent Frequency: Every 3-5 minutes Cx: loose 5/80/-1  Prenatal labs: ABO, Rh: A/Positive/-- (03/07 0000) Antibody: Negative (03/07 0000) Rubella:Immune   RPR: Nonreactive (03/07 0000)  HBsAg: Negative (03/07 0000)  HIV: Non-reactive (03/07 0000)  GBS: NEGATIVE (07/19 1316)  Chlamydia Pos-TOC neg x2 1 hr Glucola 106 Genetic screening normal Anatomy US normal   Assessment: Ashley Mcdaniel is a 30 y.o. [id number] with an IUP at [redacted]w[redacted]d presenting for active labor  Plan: 1. Admit to labor and delivery for expectant management.   Roman Sandall 08/16/2011, 8:22 PM

## 2011-08-17 ENCOUNTER — Encounter (HOSPITAL_COMMUNITY): Payer: Self-pay | Admitting: *Deleted

## 2011-08-17 LAB — RPR: RPR Ser Ql: NONREACTIVE

## 2011-08-17 LAB — ABO/RH: ABO/RH(D): A POS

## 2011-08-17 MED ORDER — SENNOSIDES-DOCUSATE SODIUM 8.6-50 MG PO TABS
2.0000 | ORAL_TABLET | Freq: Every day | ORAL | Status: DC
  Administered 2011-08-17: 2 via ORAL

## 2011-08-17 MED ORDER — IBUPROFEN 600 MG PO TABS
600.0000 mg | ORAL_TABLET | Freq: Four times a day (QID) | ORAL | Status: DC
  Administered 2011-08-17 – 2011-08-18 (×5): 600 mg via ORAL
  Filled 2011-08-17 (×5): qty 1

## 2011-08-17 MED ORDER — ZOLPIDEM TARTRATE 5 MG PO TABS: 5.0000 mg | ORAL_TABLET | Freq: Every evening | ORAL | Status: DC | PRN

## 2011-08-17 MED ORDER — ONDANSETRON HCL 4 MG PO TABS: 4.0000 mg | ORAL_TABLET | ORAL | Status: DC | PRN

## 2011-08-17 MED ORDER — WITCH HAZEL-GLYCERIN EX PADS: 1.0000 "application " | MEDICATED_PAD | CUTANEOUS | Status: DC | PRN

## 2011-08-17 MED ORDER — DIBUCAINE 1 % RE OINT: 1.0000 "application " | TOPICAL_OINTMENT | RECTAL | Status: DC | PRN

## 2011-08-17 MED ORDER — SIMETHICONE 80 MG PO CHEW: 80.0000 mg | CHEWABLE_TABLET | ORAL | Status: DC | PRN

## 2011-08-17 MED ORDER — TETANUS-DIPHTH-ACELL PERTUSSIS 5-2.5-18.5 LF-MCG/0.5 IM SUSP
0.5000 mL | Freq: Once | INTRAMUSCULAR | Status: AC
  Administered 2011-08-18: 0.5 mL via INTRAMUSCULAR
  Filled 2011-08-17: qty 0.5

## 2011-08-17 MED ORDER — DIPHENHYDRAMINE HCL 25 MG PO CAPS: 25.0000 mg | ORAL_CAPSULE | Freq: Four times a day (QID) | ORAL | Status: DC | PRN

## 2011-08-17 MED ORDER — LANOLIN HYDROUS EX OINT: TOPICAL_OINTMENT | CUTANEOUS | Status: DC | PRN

## 2011-08-17 MED ORDER — OXYCODONE-ACETAMINOPHEN 5-325 MG PO TABS: 1.0000 | ORAL_TABLET | ORAL | Status: DC | PRN

## 2011-08-17 MED ORDER — PRENATAL PLUS 27-1 MG PO TABS
1.0000 | ORAL_TABLET | Freq: Every day | ORAL | Status: DC
  Administered 2011-08-17 – 2011-08-18 (×2): 1 via ORAL
  Filled 2011-08-17 (×2): qty 1

## 2011-08-17 MED ORDER — BENZOCAINE-MENTHOL 20-0.5 % EX AERO: 1.0000 "application " | INHALATION_SPRAY | CUTANEOUS | Status: DC | PRN

## 2011-08-17 MED ORDER — ONDANSETRON HCL 4 MG/2ML IJ SOLN: 4.0000 mg | INTRAMUSCULAR | Status: DC | PRN

## 2011-08-17 NOTE — Progress Notes (Signed)
Ashley Mcdaniel is a 30 y.o. [id number] at 101w5d admitted for active labor  Subjective: Pt resting comfortably with epidural.  SROM @ 2340, clear  Objective: BP 106/59  Pulse 65  Temp(Src) 97.7 F (36.5 C) (Oral)  Resp 18  Ht 5\' 3"  (1.6 m)  Wt 171 lb 6.4 oz (77.747 kg)  BMI 30.36 kg/m2  SpO2 100%      FHT:  FHR: 125 bpm, variability: moderate,  accelerations:  Present,  decelerations:  Absent UC:   regular, every 4-6 minutes SVE:   Dilation: 7 Effacement (%): 90 Station: -1 Exam by:: LCarpenter,RN  Labs: Lab Results  Component Value Date   WBC 8.7 08/16/2011   HGB 11.5* 08/16/2011   HCT 34.1* 08/16/2011   MCV 90.0 08/16/2011   PLT 152 08/16/2011    Assessment / Plan: Spontaneous labor, progressing normally  Labor: Progressing normally Fetal Wellbeing:  Category I Pain Control:  Epidural I/D:  n/a Anticipated MOD:  NSVD  Lindaann Slough MD 08/17/2011, 12:42 AM

## 2011-08-17 NOTE — Progress Notes (Signed)
UR Chart review completed.  

## 2011-08-17 NOTE — Progress Notes (Signed)
Ashley Mcdaniel is a 30 y.o. [id number] at [redacted]w[redacted]d admitted for active labor  Subjective: Pt s/p SROM and currently with epidural.  No acute complaints. Resting well.  Objective: BP 99/58  Pulse 58  Temp(Src) 97.9 F (36.6 C) (Oral)  Resp 18  Ht 5\' 3"  (1.6 m)  Wt 77.747 kg (171 lb 6.4 oz)  BMI 30.36 kg/m2  SpO2 100%      FHT:  FHR: 130-140 bpm, variability: moderate,  accelerations:  Present,  decelerations:  Present early decelerations UC:   regular, every 2 minutes SVE: AL/C/0 Labs: Lab Results  Component Value Date   WBC 8.7 08/16/2011   HGB 11.5* 08/16/2011   HCT 34.1* 08/16/2011   MCV 90.0 08/16/2011   PLT 152 08/16/2011    Assessment / Plan: Spontaneous labor, progressing normally  Labor: Progressing normally Fetal Wellbeing:  Category II Pain Control:  Epidural I/D:  n/a Anticipated MOD:  NSVD  Amrita Radu 08/17/2011, 1:52 AM

## 2011-08-17 NOTE — Progress Notes (Signed)
Attended delivery by PGY2. Agree with note.  S/p SVD. Viable female. Intact perineum.  No complications. Mother stable to pp floor in stable condition.   Jany Buckwalter 4:15 AM 08/17/2011

## 2011-08-17 NOTE — Progress Notes (Signed)
Encounter addended by: Suella Grove on: 08/17/2011  8:42 AM<BR>     Documentation filed: Notes Section, Charges VN

## 2011-08-17 NOTE — Anesthesia Postprocedure Evaluation (Signed)
  Anesthesia Post-op Note  Patient: Ashley Mcdaniel  Procedure(s) Performed: * No procedures listed *  Patient Location: PACU and Mother/Baby  Anesthesia Type: Epidural  Level of Consciousness: awake, alert , oriented, patient cooperative and responds to stimulation  Airway and Oxygen Therapy: Patient Spontanous Breathing  Post-op Pain: none  Post-op Assessment: Post-op Vital signs reviewed, Patient's Cardiovascular Status Stable, Respiratory Function Stable, No signs of Nausea or vomiting and Pain level controlled  Post-op Vital Signs: Reviewed and stable  Complications: No apparent anesthesia complications

## 2011-08-18 DIAGNOSIS — O09219 Supervision of pregnancy with history of pre-term labor, unspecified trimester: Secondary | ICD-10-CM

## 2011-08-18 DIAGNOSIS — O09299 Supervision of pregnancy with other poor reproductive or obstetric history, unspecified trimester: Secondary | ICD-10-CM

## 2011-08-18 MED ORDER — IBUPROFEN 600 MG PO TABS: 600.0000 mg | ORAL_TABLET | Freq: Four times a day (QID) | ORAL | Status: AC

## 2011-08-18 NOTE — Discharge Summary (Signed)
Obstetric Discharge Summary Reason for Admission: onset of labor Prenatal Procedures: NST Intrapartum Procedures: spontaneous vaginal delivery Postpartum Procedures: none Complications-Operative and Postpartum: none Hemoglobin  Date Value Range Status  08/16/2011 11.5* 12.0-15.0 (g/dL) Final     HCT  Date Value Range Status  08/16/2011 34.1* 36.0-46.0 (%) Final    Discharge Diagnoses: Term Pregnancy-delivered  Discharge Information: Date: 08/18/2011 Activity: unrestricted and pelvic rest Diet: routine Medications: Ibuprophen Condition: stable Instructions: refer to practice specific booklet Discharge to: home Follow-up Information    Follow up with [facility]. Make an appointment in 6 weeks.         Newborn Data: Live born female  Birth Weight: 7 lb 13 oz (3544 g) APGAR: 8, 9  Home with mother.  [facility] 08/18/2011, 6:41 AM

## 2011-08-18 NOTE — Progress Notes (Signed)
Post Partum Day 1 Subjective: no complaints, up ad lib, voiding, tolerating PO and + flatus  Objective: Blood pressure 88/58, pulse 70, temperature 98.1 F (36.7 C), temperature source Oral, resp. rate 16, height 5\' 3"  (1.6 m), weight 77.747 kg (171 lb 6.4 oz), SpO2 100.00%, unknown if currently breastfeeding.  Physical Exam:  General: alert, cooperative and no distress Lochia: appropriate Uterine Fundus: firm Incision: healing well DVT Evaluation: No evidence of DVT seen on physical exam. Desires early discharge.  Basename 08/16/11 2046  HGB 11.5*  HCT 34.1*    Assessment/Plan: Discharge home and Breastfeeding Desires early discharge.  Will d/c home.    LOS: 2 days   [facility] 08/18/2011, 6:37 AM

## 2011-08-19 NOTE — Discharge Summary (Signed)
Attestation of Attending Supervision of Advanced Practitioner: Evaluation and management procedures were performed by the PA/NP/CNM/OB Fellow under my supervision/collaboration. Agree with the note and plan of care for this patient.  Hank Walling A 08/19/2011 11:00 AM

## 2011-09-27 ENCOUNTER — Ambulatory Visit (INDEPENDENT_AMBULATORY_CARE_PROVIDER_SITE_OTHER): Payer: Medicaid Other | Admitting: Family Medicine

## 2011-09-27 ENCOUNTER — Encounter: Payer: Self-pay | Admitting: Family Medicine

## 2011-09-27 NOTE — Progress Notes (Signed)
  Subjective:    Patient ID: Ashley Mcdaniel, female    DOB: August 08, 1981, 30 y.o.   MRN: [id number]  HPI Patient seen for 6 week postpartum visit.  She is breastfeeding without difficulties.  Her vaginal bleeding has resolved.  She denies pain with breastfeeding, pelvic pain, urinary incontinence, depression.  She has not resumed sexual activity - she desires the ParaGard IUD and will make an appointment with the HD for this.   Review of Systems  All other systems reviewed and are negative.       Objective:   Physical Exam  Constitutional: She appears well-developed and well-nourished.  Abdominal: Soft. She exhibits no distension and no mass. There is no tenderness. There is no rebound and no guarding.  Genitourinary: Uterus normal. There is no rash, tenderness, lesion or injury on the right labia. There is no rash, tenderness, lesion or injury on the left labia. Uterus is not deviated, not fixed and not tender. Right adnexum displays no mass, no tenderness and no fullness. Left adnexum displays no mass, no tenderness and no fullness. No signs of injury around the vagina.       6 week size          Assessment & Plan:  #1. Postpartum - Patient will make appt with HD for placement of Paragard.  She will use condoms until this is placed.  She also receives her women's care there and will follow up with them in 1 year for annual exam.

## 2011-09-28 ENCOUNTER — Encounter (HOSPITAL_COMMUNITY): Payer: Self-pay

## 2011-10-16 ENCOUNTER — Encounter (HOSPITAL_COMMUNITY): Payer: Self-pay | Admitting: *Deleted

## 2011-10-17 ENCOUNTER — Other Ambulatory Visit: Payer: Self-pay | Admitting: Family Medicine

## 2011-10-17 DIAGNOSIS — N63 Unspecified lump in unspecified breast: Secondary | ICD-10-CM

## 2011-10-23 ENCOUNTER — Other Ambulatory Visit: Payer: Self-pay

## 2011-10-25 ENCOUNTER — Emergency Department (HOSPITAL_COMMUNITY)
Admission: EM | Admit: 2011-10-25 | Discharge: 2011-10-25 | Disposition: A | Discharge status: Home / Self Care | Payer: Medicaid Other | Attending: Emergency Medicine | Admitting: Emergency Medicine

## 2011-10-25 DIAGNOSIS — Z711 Person with feared health complaint in whom no diagnosis is made: Secondary | ICD-10-CM | POA: Insufficient documentation

## 2011-10-31 ENCOUNTER — Other Ambulatory Visit: Payer: Self-pay

## 2011-12-02 ENCOUNTER — Emergency Department (HOSPITAL_COMMUNITY)
Admission: EM | Admit: 2011-12-02 | Discharge: 2011-12-02 | Disposition: A | Discharge status: Home / Self Care | Payer: No Typology Code available for payment source | Attending: Emergency Medicine | Admitting: Emergency Medicine

## 2011-12-02 ENCOUNTER — Encounter (HOSPITAL_COMMUNITY): Payer: Self-pay

## 2011-12-02 DIAGNOSIS — S161XXA Strain of muscle, fascia and tendon at neck level, initial encounter: Secondary | ICD-10-CM

## 2011-12-02 DIAGNOSIS — M542 Cervicalgia: Secondary | ICD-10-CM | POA: Insufficient documentation

## 2011-12-02 DIAGNOSIS — S139XXA Sprain of joints and ligaments of unspecified parts of neck, initial encounter: Secondary | ICD-10-CM | POA: Insufficient documentation

## 2011-12-02 MED ORDER — CYCLOBENZAPRINE HCL 10 MG PO TABS: 5.0000 mg | ORAL_TABLET | Freq: Three times a day (TID) | ORAL | Status: AC | PRN

## 2011-12-02 MED ORDER — IBUPROFEN 600 MG PO TABS: 600.0000 mg | ORAL_TABLET | Freq: Three times a day (TID) | ORAL | Status: AC
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
## 2011-12-02 NOTE — ED Provider Notes (Signed)
Medical screening examination/treatment/procedure(s) were performed by non-physician practitioner and as supervising physician I was immediately available for consultation/collaboration.   Gwyneth Sprout, MD 12/02/11 1950

## 2013-11-16 ENCOUNTER — Encounter: Payer: Self-pay | Admitting: Internal Medicine

## 2013-12-09 ENCOUNTER — Encounter: Payer: Self-pay | Admitting: Internal Medicine

## 2013-12-10 ENCOUNTER — Ambulatory Visit (INDEPENDENT_AMBULATORY_CARE_PROVIDER_SITE_OTHER): Payer: BC Managed Care – PPO | Admitting: Internal Medicine

## 2013-12-10 ENCOUNTER — Encounter: Payer: Self-pay | Admitting: Internal Medicine

## 2013-12-10 VITALS — BP 100/70 | HR 66 | Ht 63.0 in | Wt 166.0 lb

## 2013-12-10 DIAGNOSIS — Z800 Family history of malignant neoplasm of digestive organs: Secondary | ICD-10-CM | POA: Insufficient documentation

## 2013-12-10 DIAGNOSIS — Z9189 Other specified personal risk factors, not elsewhere classified: Secondary | ICD-10-CM

## 2013-12-10 DIAGNOSIS — K5900 Constipation, unspecified: Secondary | ICD-10-CM

## 2013-12-10 DIAGNOSIS — Z789 Other specified health status: Secondary | ICD-10-CM

## 2013-12-10 MED ORDER — MOVIPREP 100 G PO SOLR: ORAL | Status: DC

## 2013-12-10 NOTE — Progress Notes (Signed)
Patient ID: Ashley Mcdaniel, female   DOB: Apr 07, 1981, 32 y.o.   MRN: [id number] HPI: Ashley Mcdaniel is a 32 year old female with little past medical history who presents on self-referral to discuss constipation and family history of colon cancer. She is here today with Ashley Mcdaniel. She reports overall she feels fine. She feels that at times she is constipated. She reports she did have a bowel movement every other day to as infrequent as every 3 days. She feels like every 3 days as not normal. She denies abdominal pain. No nausea or vomiting. No weight loss. Good appetite. No blood in her stool or melena. She reports her stool is never hard. She also denies any history of diarrhea.  She has recently started an over-the-counter herbal supplement for colon cleansing. Since starting this she is being joint once daily.  Family history notable for a sister who was diagnosed with colon cancer at age 46 and died at the same age. She is 7 siblings, one of which died at 50 of colon cancer, and only one other sibling has had colonoscopy to date.  This other sibling did not have colon polyps.  Her father has hepatitis C cirrhosis and HCC  Past Medical History  Diagnosis Date  . Preterm delivery     at 26 weeks    Past Surgical History  Procedure Laterality Date  . Cholecystectomy  2001    Current Outpatient Prescriptions  Medication Sig Dispense Refill  . OVER THE COUNTER MEDICATION Herbal Life 21 day colon clean , pt started 12/06/13      . MOVIPREP 100 G SOLR Use per prep instruction  1 kit  0   No current facility-administered medications for this visit.    Allergies  Allergen Reactions  . Penicillins Anaphylaxis  . Penicillins Cross Reactors Shortness Of Breath    Family History  Problem Relation Age of Onset  . Hyperlipidemia Mother   . Depression Mother   . Kidney disease Father   . Colon cancer Sister 60  . Liver cancer Father     History  Substance Use Topics  .  Smoking status: Never Smoker   . Smokeless tobacco: Never Used  . Alcohol Use: No    ROS: As per history of present illness, otherwise negative  BP 100/70  Pulse 66  Ht 5\' 3"  (1.6 m)  Wt 166 lb (75.297 kg)  BMI 29.41 kg/m2  LMP 11/26/2013 Constitutional: Well-developed and well-nourished. No distress. HEENT: Normocephalic and atraumatic. Oropharynx is clear and moist. No oropharyngeal exudate. Conjunctivae are normal.  No scleral icterus. Neck: Neck supple. Trachea midline. Cardiovascular: Normal rate, regular rhythm and intact distal pulses. No M/R/G Pulmonary/chest: Effort normal and breath sounds normal. No wheezing, rales or rhonchi. Abdominal: Soft, nontender, nondistended. Bowel sounds active throughout. There are no masses palpable. No hepatosplenomegaly. Extremities: no clubbing, cyanosis, or edema Lymphadenopathy: No cervical adenopathy noted. Neurological: Alert and oriented to person place and time. Skin: Skin is warm and dry. No rashes noted. Psychiatric: Normal mood and affect. Behavior is normal.  ASSESSMENT/PLAN: Ashley Mcdaniel is a 32 year old female with little past medical history who presents on self-referral to discuss constipation and family history of colon cancer.  1.  Constipation -- mild and intermittent. I have reassured her that having a bowel movement every 2-3 days is not necessarily abnormal. She can increase her hydration and add Benefiber or other fiber supplement to try to increase regularity. See #2  2.  Family hx of colon cancer, 1st degree  relative -- her family history of colon cancer is significant. Her sister was diagnosed with colorectal cancer at age 3. We've discussed guidelines regarding high-risk screening, which would recommend beginning colorectal cancer screening at age 14 for her and her other siblings.  Given that she is 13 years old, I recommend proceeding to colonoscopy at this time. The test was discussed including the risks and  benefits and she is agreeable to proceed.

## 2013-12-10 NOTE — Patient Instructions (Signed)
You have been scheduled for a colonoscopy with propofol. Please follow written instructions given to you at your visit today.  Please pick up your prep kit at the pharmacy within the next 1-3 days. If you use inhalers (even only as needed), please bring them with you on the day of your procedure. Your physician has requested that you go to www.startemmi.com and enter the access code given to you at your visit today. This web site gives a general overview about your procedure. However, you should still follow specific instructions given to you by our office regarding your preparation for the procedure.  We have sent the following medications to your pharmacy for you to pick up at your convenience: Moviprep                                               We are excited to introduce MyChart, a new best-in-class service that provides you online access to important information in your electronic medical record. We want to make it easier for you to view your health information - all in one secure location - when and where you need it. We expect MyChart will enhance the quality of care and service we provide.  When you register for MyChart, you can:    View your test results.    Request appointments and receive appointment reminders via email.    Request medication renewals.    View your medical history, allergies, medications and immunizations.    Communicate with your physician's office through a password-protected site.    Conveniently print information such as your medication lists.  To find out if MyChart is right for you, please talk to a member of our clinical staff today. We will gladly answer your questions about this free health and wellness tool.  If you are age 18 or older and want a member of your family to have access to your record, you must provide written consent by completing a proxy form available at our office. Please speak to our clinical staff about guidelines regarding  accounts for patients younger than age 18.  As you activate your MyChart account and need any technical assistance, please call the MyChart technical support line at [phone] ([phone]) or email your question to [email or web address]. If you email your question(s), please include your name, a return phone number and the best time to reach you.  If you have non-urgent health-related questions, you can send a message to our office through MyChart at [email or web address]. If you have a medical emergency, call 911.  Thank you for using MyChart as your new health and wellness resource!   MyChart licensed from [employer],  1999-2010. Patents Pending.    

## 2013-12-15 ENCOUNTER — Encounter: Payer: Self-pay | Admitting: Internal Medicine

## 2014-01-12 ENCOUNTER — Encounter: Payer: Self-pay | Admitting: Internal Medicine

## 2014-01-12 ENCOUNTER — Ambulatory Visit (AMBULATORY_SURGERY_CENTER): Payer: BC Managed Care – PPO | Admitting: Internal Medicine

## 2014-01-12 VITALS — BP 116/50 | HR 54 | Temp 98.2°F | Resp 24 | Ht 63.0 in | Wt 166.0 lb

## 2014-01-12 DIAGNOSIS — Z1211 Encounter for screening for malignant neoplasm of colon: Secondary | ICD-10-CM

## 2014-01-12 DIAGNOSIS — Z800 Family history of malignant neoplasm of digestive organs: Secondary | ICD-10-CM

## 2014-01-12 MED ORDER — SODIUM CHLORIDE 0.9 % IV SOLN: 500.0000 mL | INTRAVENOUS | Status: DC

## 2014-01-12 NOTE — Op Note (Signed)
[facility] [address] [place], [id number]   COLONOSCOPY PROCEDURE REPORT  PATIENT: Ashley PyleFlores-Jasso, Aliviyah  MR#: [id number] BIRTHDATE: Dec 13, 1981 , 33  yrs. old GENDER: Female ENDOSCOPIST: Beverley FiedlerJay M Evalise Abruzzese, MD PROCEDURE DATE:  01/12/2014 PROCEDURE:   Colonoscopy, screening First Screening Colonoscopy - Avg.  risk and is 31 yrs.  old or older - No.  Prior Negative Screening - Now for repeat screening. N/A  History of Adenoma - Now for follow-up colonoscopy & has been > or = to 3 yrs.  N/A  Polyps Removed Today? Yes. ASA CLASS:   Class I INDICATIONS:Patient's immediate family history of colon cancer (sister age 31). MEDICATIONS: MAC sedation, administered by CRNA and propofol (Diprivan) 250mg  IV  DESCRIPTION OF PROCEDURE:   After the risks benefits and alternatives of the procedure were thoroughly explained, informed consent was obtained.  A digital rectal exam revealed no rectal mass.   The LB [id number]-H190 [id number]  endoscope was introduced through the anus and advanced to the cecum, which was identified by both the appendix and ileocecal valve. No adverse events experienced. The quality of the prep was good, using MoviPrep  The instrument was then slowly withdrawn as the colon was fully examined.      COLON FINDINGS: A normal appearing cecum, ileocecal valve, and appendiceal orifice were identified.  The ascending, hepatic flexure, transverse, splenic flexure, descending, sigmoid colon and rectum appeared unremarkable.  No polyps or cancers were seen. Retroflexed views revealed no abnormalities. The time to cecum=1 minutes 23 seconds.  Withdrawal time=11 minutes 36 seconds.  The scope was withdrawn and the procedure completed.  COMPLICATIONS: There were no complications.  ENDOSCOPIC IMPRESSION: Normal colon  RECOMMENDATIONS: Given your significant family history of colon cancer, you should have a repeat colonoscopy in 5 years   eSigned:  Beverley FiedlerJay M Deliliah Spranger, MD  01/12/2014 3:02 PM   cc: The Patient

## 2014-01-12 NOTE — Progress Notes (Signed)
Stable to RR 

## 2014-01-12 NOTE — Patient Instructions (Signed)
YOU HAD AN ENDOSCOPIC PROCEDURE TODAY AT THE [facility]: Refer to the procedure report that was given to you for any specific questions about what was found during the examination.  If the procedure report does not answer your questions, please call your gastroenterologist to clarify.  If you requested that your care partner not be given the details of your procedure findings, then the procedure report has been included in a sealed envelope for you to review at your convenience later.  YOU SHOULD EXPECT: Some feelings of bloating in the abdomen. Passage of more gas than usual.  Walking can help get rid of the air that was put into your GI tract during the procedure and reduce the bloating. If you had a lower endoscopy (such as a colonoscopy or flexible sigmoidoscopy) you may notice spotting of blood in your stool or on the toilet paper. If you underwent a bowel prep for your procedure, then you may not have a normal bowel movement for a few days.  DIET: Your first meal following the procedure should be a light meal and then it is ok to progress to your normal diet.  A half-sandwich or bowl of soup is an example of a good first meal.  Heavy or fried foods are harder to digest and may make you feel nauseous or bloated.  Likewise meals heavy in dairy and vegetables can cause extra gas to form and this can also increase the bloating.  Drink plenty of fluids but you should avoid alcoholic beverages for 24 hours.  ACTIVITY: Your care partner should take you home directly after the procedure.  You should plan to take it easy, moving slowly for the rest of the day.  You can resume normal activity the day after the procedure however you should NOT DRIVE or use heavy machinery for 24 hours (because of the sedation medicines used during the test).    SYMPTOMS TO REPORT IMMEDIATELY: A gastroenterologist can be reached at any hour.  During normal business hours, 8:30 AM to 5:00 PM Monday through Friday,  call [phone].  After hours and on weekends, please call the GI answering service at [phone] who will take a message and have the physician on call contact you.   Following lower endoscopy (colonoscopy or flexible sigmoidoscopy):  Excessive amounts of blood in the stool  Significant tenderness or worsening of abdominal pains  Swelling of the abdomen that is new, acute  Fever of 100F or higher  FOLLOW UP: If any biopsies were taken you will be contacted by phone or by letter within the next 1-3 weeks.  Call your gastroenterologist if you have not heard about the biopsies in 3 weeks.  Our staff will call the home number listed on your records the next business day following your procedure to check on you and address any questions or concerns that you may have at that time regarding the information given to you following your procedure. This is a courtesy call and so if there is no answer at the home number and we have not heard from you through the emergency physician on call, we will assume that you have returned to your regular daily activities without incident.  SIGNATURES/CONFIDENTIALITY: You and/or your care partner have signed paperwork which will be entered into your electronic medical record.  These signatures attest to the fact that that the information above on your After Visit Summary has been reviewed and is understood.  Full responsibility of the confidentiality of this   discharge information lies with you and/or your care-partner.  Recommendations See procedure report  

## 2014-01-13 ENCOUNTER — Telehealth: Payer: Self-pay | Admitting: *Deleted

## 2014-01-13 NOTE — Telephone Encounter (Signed)
  Follow up Call-  Call back number 01/12/2014  Post procedure Call Back phone  # [phone]  Permission to leave phone message Yes     Patient questions:  Do you have a fever, pain , or abdominal swelling? no Pain Score  0 *  Have you tolerated food without any problems? yes  Have you been able to return to your normal activities? yes  Do you have any questions about your discharge instructions: Diet   no Medications  no Follow up visit  no  Do you have questions or concerns about your Care? no  Actions: * If pain score is 4 or above: No action needed, pain <4.

## 2014-11-01 ENCOUNTER — Encounter: Payer: Self-pay | Admitting: Internal Medicine

## 2015-09-06 ENCOUNTER — Encounter (HOSPITAL_COMMUNITY): Payer: Self-pay | Admitting: *Deleted

## 2015-09-12 ENCOUNTER — Encounter (HOSPITAL_COMMUNITY): Payer: Self-pay

## 2015-09-12 ENCOUNTER — Ambulatory Visit (HOSPITAL_COMMUNITY)
Admission: RE | Admit: 2015-09-12 | Discharge: 2015-09-12 | Disposition: A | Discharge status: Home / Self Care | Payer: Self-pay | Source: Ambulatory Visit | Attending: Obstetrics and Gynecology | Admitting: Obstetrics and Gynecology

## 2015-09-12 VITALS — BP 110/62 | Temp 98.5°F | Ht 62.0 in | Wt 173.0 lb

## 2015-09-12 DIAGNOSIS — R87610 Atypical squamous cells of undetermined significance on cytologic smear of cervix (ASC-US): Secondary | ICD-10-CM

## 2015-09-12 NOTE — Progress Notes (Signed)
CLINIC:   [facility] [employer]) Clinic  REASON FOR VISIT: Well-woman exam   HISTORY OF PRESENT ILLNESS:   [name] is a 34 y.o. female who presents to the [facility] today for clinical breast exam. No history of mammogram. Her last pap smear was performed in August 2016 and showed ASC-US; + HPV.    REVIEW OF SYSTEMS:   Denies breast pain, nodularity, skin changes, nipple inversion, or nipple discharge bilaterally.  Denies any pelvic pain, pressure, or abnormal vaginal bleeding.   ALLERGIES: Allergies  Allergen Reactions  . Penicillins Anaphylaxis  . Penicillins Cross Reactors Shortness Of Breath    MEDICATIONS:  Current outpatient prescriptions:  .  OVER THE COUNTER MEDICATION, Herbal Life 21 day colon clean , pt started 12/06/13, Disp: , Rfl:   PHYSICAL EXAM:   BP 110/62 mmHg  Temp(Src) 98.5 F (36.9 C) (Oral)  Ht  (1.575 m)  Wt 173 lb (78.472 kg)  BMI 31.63 kg/m2  LMP 08/30/2015 (Approximate)  General: Well-nourished, well-appearing female in no acute distress.  She is unaccompanied in clinic today.  Ashley Mcdaniel, Ashley Mcdaniel was present during physical exam for this patient.   Breasts: Bilateral breasts exposed and observed with patient standing (arms at side, arms on hips, arms on hips flexed forward, and arms over head).  No gross abnormalities including breast skin puckering or dimpling noted on observation.  Breasts symmetrical without evidence of skin redness, thickening, or peau d'orange appearance. No nipple retraction or nipple discharge noted bilaterally.  No breast nodularity palpated in bilateral breasts. Axillary lymph nodes: No axillary lymphadenopathy bilaterally.      ASSESSMENT & PLAN:  1. Breast cancer screening: [name] has no palpable breast abnormalities on her clinical breast exam today. Mammgram not indicated due to age. She was given instructions and educational materials regarding breast self-awareness. Ms.  Ashley Mcdaniel is aware of this plan and agrees with it.   2. Cervical cancer screening: [name] had a recent abnormal pap. She will have a colposcopy as scheduled at [facility]. Results will be given to the patient by that clinic.   [name] was encouraged to ask questions and all questions were answered to her satisfaction.      Ashley Pare, DNP, AGPCNP-BC, [facility] [phone]

## 2015-09-28 ENCOUNTER — Encounter: Payer: Self-pay | Admitting: Obstetrics and Gynecology

## 2015-10-17 ENCOUNTER — Other Ambulatory Visit (HOSPITAL_COMMUNITY)
Admission: RE | Admit: 2015-10-17 | Discharge: 2015-10-17 | Disposition: A | Discharge status: Home / Self Care | Payer: Self-pay | Source: Ambulatory Visit | Attending: Obstetrics and Gynecology | Admitting: Obstetrics and Gynecology

## 2015-10-17 ENCOUNTER — Encounter: Payer: Self-pay | Admitting: Obstetrics and Gynecology

## 2015-10-17 ENCOUNTER — Ambulatory Visit (INDEPENDENT_AMBULATORY_CARE_PROVIDER_SITE_OTHER): Payer: Self-pay | Admitting: Obstetrics and Gynecology

## 2015-10-17 VITALS — BP 93/60 | HR 86 | Ht 63.0 in | Wt 173.0 lb

## 2015-10-17 DIAGNOSIS — Z01812 Encounter for preprocedural laboratory examination: Secondary | ICD-10-CM

## 2015-10-17 DIAGNOSIS — IMO0002 Reserved for concepts with insufficient information to code with codable children: Secondary | ICD-10-CM | POA: Insufficient documentation

## 2015-10-17 DIAGNOSIS — R896 Abnormal cytological findings in specimens from other organs, systems and tissues: Secondary | ICD-10-CM

## 2015-10-17 DIAGNOSIS — R87619 Unspecified abnormal cytological findings in specimens from cervix uteri: Secondary | ICD-10-CM | POA: Insufficient documentation

## 2015-10-17 DIAGNOSIS — Z3202 Encounter for pregnancy test, result negative: Secondary | ICD-10-CM

## 2015-10-17 LAB — POCT PREGNANCY, URINE: Preg Test, Ur: NEGATIVE

## 2015-10-17 NOTE — Progress Notes (Signed)
34 yo non-smoker with ASCUS +HPV on pap smear on 08/17/2015 here for colposcopy.  Patient given informed consent, signed copy in the chart, time out was performed.  Placed in lithotomy position. Cervix viewed with speculum and colposcope after application of acetic acid. IUD strings visualized at the os  Colposcopy adequate?  yes Acetowhite lesions?yes at 3 o'clock Punctation?no Mosaicism?  no Abnormal vasculature?  no Biopsies?yes at 3 o'clock ECC?yes  COMMENTS: Patient was given post procedure instructions.  She will return in 2 weeks for results.  Catalina AntiguaONSTANT,Narada Uzzle, MD

## 2015-10-19 ENCOUNTER — Telehealth: Payer: Self-pay | Admitting: *Deleted

## 2015-10-19 NOTE — Telephone Encounter (Signed)
Called patient per Dr Jolayne Pantheronstant to inform her of negative endometrial biopsy results. Dr Jolayne Pantheronstant wants her to return in 6 months for a repeat PAP. Will forward message to the front desk so that they will call her to schedule it. Understanding voiced.

## 2016-05-16 NOTE — Addendum Note (Signed)
Encounter addended by: Christine P Brannock, RN on: 05/16/2016 10:06 AM<BR>     Documentation filed: Charges VN

## 2016-08-07 ENCOUNTER — Telehealth (HOSPITAL_COMMUNITY): Payer: Self-pay | Admitting: *Deleted

## 2016-08-07 NOTE — Telephone Encounter (Signed)
Telephoned patient at home # and disconnected. Used interpreter Albertina SenegalMarly Adams.

## 2018-12-29 ENCOUNTER — Encounter: Payer: Self-pay | Admitting: *Deleted

## 2019-02-27 ENCOUNTER — Encounter: Payer: Self-pay | Admitting: Internal Medicine
